# Patient Record
Sex: Female | Born: 1960
Health system: Southern US, Community
[De-identification: ages and names within clinical notes are randomized; demographics above are authoritative.]

---

## 2003-02-05 ENCOUNTER — Other Ambulatory Visit: Admission: RE | Admit: 2003-02-05 | Discharge: 2003-02-05 | Payer: Self-pay | Admitting: *Deleted

## 2005-01-14 ENCOUNTER — Other Ambulatory Visit: Admission: RE | Admit: 2005-01-14 | Discharge: 2005-01-14 | Payer: Self-pay | Admitting: *Deleted

## 2005-02-08 ENCOUNTER — Encounter: Admission: RE | Admit: 2005-02-08 | Discharge: 2005-02-08 | Payer: Self-pay | Admitting: *Deleted

## 2005-02-15 ENCOUNTER — Encounter: Admission: RE | Admit: 2005-02-15 | Discharge: 2005-05-16 | Payer: Self-pay | Admitting: Family Medicine

## 2006-01-16 ENCOUNTER — Other Ambulatory Visit: Admission: RE | Admit: 2006-01-16 | Discharge: 2006-01-16 | Payer: Self-pay | Admitting: *Deleted

## 2006-02-09 ENCOUNTER — Encounter: Admission: RE | Admit: 2006-02-09 | Discharge: 2006-02-09 | Payer: Self-pay | Admitting: Family Medicine

## 2007-01-25 ENCOUNTER — Other Ambulatory Visit: Admission: RE | Admit: 2007-01-25 | Discharge: 2007-01-25 | Payer: Self-pay | Admitting: Family Medicine

## 2007-02-26 ENCOUNTER — Encounter: Admission: RE | Admit: 2007-02-26 | Discharge: 2007-02-26 | Payer: Self-pay | Admitting: Family Medicine

## 2007-12-25 ENCOUNTER — Other Ambulatory Visit: Admission: RE | Admit: 2007-12-25 | Discharge: 2007-12-25 | Payer: Self-pay | Admitting: Family Medicine

## 2008-02-27 ENCOUNTER — Encounter: Admission: RE | Admit: 2008-02-27 | Discharge: 2008-02-27 | Payer: Self-pay | Admitting: Family Medicine

## 2008-12-26 ENCOUNTER — Other Ambulatory Visit: Admission: RE | Admit: 2008-12-26 | Discharge: 2008-12-26 | Payer: Self-pay | Admitting: Family Medicine

## 2009-12-28 ENCOUNTER — Encounter: Admission: RE | Admit: 2009-12-28 | Discharge: 2009-12-28 | Payer: Self-pay | Admitting: Family Medicine

## 2009-12-31 ENCOUNTER — Other Ambulatory Visit: Admission: RE | Admit: 2009-12-31 | Discharge: 2009-12-31 | Payer: Self-pay | Admitting: Family Medicine

## 2010-10-20 ENCOUNTER — Other Ambulatory Visit: Payer: Self-pay | Admitting: Family Medicine

## 2010-10-20 DIAGNOSIS — Z1231 Encounter for screening mammogram for malignant neoplasm of breast: Secondary | ICD-10-CM

## 2010-12-30 ENCOUNTER — Ambulatory Visit
Admission: RE | Admit: 2010-12-30 | Discharge: 2010-12-30 | Disposition: A | Discharge status: Home / Self Care | Payer: BC Managed Care – PPO | Source: Ambulatory Visit | Attending: Family Medicine | Admitting: Family Medicine

## 2010-12-30 DIAGNOSIS — Z1231 Encounter for screening mammogram for malignant neoplasm of breast: Secondary | ICD-10-CM

## 2011-10-25 ENCOUNTER — Other Ambulatory Visit: Payer: Self-pay | Admitting: Family Medicine

## 2011-10-25 DIAGNOSIS — Z1231 Encounter for screening mammogram for malignant neoplasm of breast: Secondary | ICD-10-CM

## 2011-12-27 ENCOUNTER — Other Ambulatory Visit (HOSPITAL_COMMUNITY)
Admission: RE | Admit: 2011-12-27 | Discharge: 2011-12-27 | Disposition: A | Discharge status: Home / Self Care | Payer: BC Managed Care – PPO | Source: Ambulatory Visit | Attending: Family Medicine | Admitting: Family Medicine

## 2011-12-27 ENCOUNTER — Other Ambulatory Visit: Payer: Self-pay | Admitting: Family Medicine

## 2011-12-27 DIAGNOSIS — Z124 Encounter for screening for malignant neoplasm of cervix: Secondary | ICD-10-CM | POA: Insufficient documentation

## 2011-12-27 DIAGNOSIS — Z1159 Encounter for screening for other viral diseases: Secondary | ICD-10-CM | POA: Insufficient documentation

## 2012-01-03 ENCOUNTER — Ambulatory Visit
Admission: RE | Admit: 2012-01-03 | Discharge: 2012-01-03 | Disposition: A | Discharge status: Home / Self Care | Payer: BC Managed Care – PPO | Source: Ambulatory Visit | Attending: Family Medicine | Admitting: Family Medicine

## 2012-01-03 DIAGNOSIS — Z1231 Encounter for screening mammogram for malignant neoplasm of breast: Secondary | ICD-10-CM

## 2012-11-26 ENCOUNTER — Other Ambulatory Visit: Payer: Self-pay

## 2012-11-26 DIAGNOSIS — Z1231 Encounter for screening mammogram for malignant neoplasm of breast: Secondary | ICD-10-CM

## 2013-01-03 ENCOUNTER — Ambulatory Visit
Admission: RE | Admit: 2013-01-03 | Discharge: 2013-01-03 | Disposition: A | Discharge status: Home / Self Care | Payer: BC Managed Care – PPO | Source: Ambulatory Visit

## 2013-01-03 DIAGNOSIS — Z1231 Encounter for screening mammogram for malignant neoplasm of breast: Secondary | ICD-10-CM

## 2014-11-04 ENCOUNTER — Other Ambulatory Visit: Payer: Self-pay

## 2014-11-04 DIAGNOSIS — Z1231 Encounter for screening mammogram for malignant neoplasm of breast: Secondary | ICD-10-CM

## 2014-12-31 ENCOUNTER — Ambulatory Visit
Admission: RE | Admit: 2014-12-31 | Discharge: 2014-12-31 | Disposition: A | Discharge status: Home / Self Care | Payer: BLUE CROSS/BLUE SHIELD | Source: Ambulatory Visit

## 2014-12-31 DIAGNOSIS — Z1231 Encounter for screening mammogram for malignant neoplasm of breast: Secondary | ICD-10-CM

## 2015-11-24 ENCOUNTER — Other Ambulatory Visit: Payer: Self-pay

## 2015-11-24 DIAGNOSIS — Z1231 Encounter for screening mammogram for malignant neoplasm of breast: Secondary | ICD-10-CM

## 2016-01-01 ENCOUNTER — Ambulatory Visit
Admission: RE | Admit: 2016-01-01 | Discharge: 2016-01-01 | Disposition: A | Discharge status: Home / Self Care | Payer: 59 | Source: Ambulatory Visit

## 2016-01-01 DIAGNOSIS — Z1231 Encounter for screening mammogram for malignant neoplasm of breast: Secondary | ICD-10-CM

## 2016-11-08 ENCOUNTER — Other Ambulatory Visit: Payer: Self-pay | Admitting: Family Medicine

## 2016-11-08 DIAGNOSIS — Z1231 Encounter for screening mammogram for malignant neoplasm of breast: Secondary | ICD-10-CM

## 2016-11-15 ENCOUNTER — Other Ambulatory Visit: Payer: Self-pay | Admitting: Family Medicine

## 2016-11-15 DIAGNOSIS — Z780 Asymptomatic menopausal state: Secondary | ICD-10-CM

## 2017-01-02 ENCOUNTER — Ambulatory Visit: Payer: 59

## 2017-01-02 ENCOUNTER — Ambulatory Visit
Admission: RE | Admit: 2017-01-02 | Discharge: 2017-01-02 | Disposition: A | Discharge status: Home / Self Care | Payer: 59 | Source: Ambulatory Visit | Attending: Family Medicine | Admitting: Family Medicine

## 2017-01-02 DIAGNOSIS — Z780 Asymptomatic menopausal state: Secondary | ICD-10-CM | POA: Diagnosis not present

## 2017-01-02 DIAGNOSIS — M810 Age-related osteoporosis without current pathological fracture: Secondary | ICD-10-CM | POA: Diagnosis not present

## 2017-01-02 DIAGNOSIS — Z1231 Encounter for screening mammogram for malignant neoplasm of breast: Secondary | ICD-10-CM | POA: Diagnosis not present

## 2017-01-04 DIAGNOSIS — D126 Benign neoplasm of colon, unspecified: Secondary | ICD-10-CM | POA: Diagnosis not present

## 2017-01-04 DIAGNOSIS — Z1211 Encounter for screening for malignant neoplasm of colon: Secondary | ICD-10-CM | POA: Diagnosis not present

## 2017-01-04 DIAGNOSIS — K621 Rectal polyp: Secondary | ICD-10-CM | POA: Diagnosis not present

## 2017-01-06 DIAGNOSIS — M810 Age-related osteoporosis without current pathological fracture: Secondary | ICD-10-CM | POA: Diagnosis not present

## 2017-02-13 DIAGNOSIS — Z0100 Encounter for examination of eyes and vision without abnormal findings: Secondary | ICD-10-CM | POA: Diagnosis not present

## 2017-03-29 DIAGNOSIS — E559 Vitamin D deficiency, unspecified: Secondary | ICD-10-CM | POA: Diagnosis not present

## 2017-08-23 DIAGNOSIS — R0982 Postnasal drip: Secondary | ICD-10-CM | POA: Diagnosis not present

## 2017-11-14 ENCOUNTER — Other Ambulatory Visit: Payer: Self-pay | Admitting: Family Medicine

## 2017-11-14 DIAGNOSIS — Z1231 Encounter for screening mammogram for malignant neoplasm of breast: Secondary | ICD-10-CM

## 2017-12-26 ENCOUNTER — Other Ambulatory Visit: Payer: Self-pay | Admitting: Family Medicine

## 2017-12-26 ENCOUNTER — Other Ambulatory Visit (HOSPITAL_COMMUNITY)
Admission: RE | Admit: 2017-12-26 | Discharge: 2017-12-26 | Disposition: A | Discharge status: Home / Self Care | Payer: 59 | Source: Ambulatory Visit | Attending: Family Medicine | Admitting: Family Medicine

## 2017-12-26 DIAGNOSIS — Z0000 Encounter for general adult medical examination without abnormal findings: Secondary | ICD-10-CM | POA: Diagnosis not present

## 2017-12-26 DIAGNOSIS — Z124 Encounter for screening for malignant neoplasm of cervix: Secondary | ICD-10-CM | POA: Diagnosis not present

## 2017-12-26 DIAGNOSIS — E559 Vitamin D deficiency, unspecified: Secondary | ICD-10-CM | POA: Diagnosis not present

## 2017-12-26 DIAGNOSIS — Z1159 Encounter for screening for other viral diseases: Secondary | ICD-10-CM | POA: Diagnosis not present

## 2017-12-26 DIAGNOSIS — M810 Age-related osteoporosis without current pathological fracture: Secondary | ICD-10-CM | POA: Diagnosis not present

## 2017-12-27 LAB — CYTOLOGY - PAP
Diagnosis: NEGATIVE
HPV: NOT DETECTED

## 2018-01-03 ENCOUNTER — Ambulatory Visit
Admission: RE | Admit: 2018-01-03 | Discharge: 2018-01-03 | Disposition: A | Discharge status: Home / Self Care | Payer: 59 | Source: Ambulatory Visit | Attending: Family Medicine | Admitting: Family Medicine

## 2018-01-03 DIAGNOSIS — Z1231 Encounter for screening mammogram for malignant neoplasm of breast: Secondary | ICD-10-CM | POA: Diagnosis not present

## 2018-01-05 ENCOUNTER — Ambulatory Visit (INDEPENDENT_AMBULATORY_CARE_PROVIDER_SITE_OTHER): Payer: 59

## 2018-01-05 ENCOUNTER — Encounter: Payer: Self-pay | Admitting: Podiatry

## 2018-01-05 ENCOUNTER — Other Ambulatory Visit: Payer: Self-pay | Admitting: Podiatry

## 2018-01-05 ENCOUNTER — Ambulatory Visit (INDEPENDENT_AMBULATORY_CARE_PROVIDER_SITE_OTHER): Payer: 59 | Admitting: Podiatry

## 2018-01-05 DIAGNOSIS — M779 Enthesopathy, unspecified: Secondary | ICD-10-CM

## 2018-01-05 DIAGNOSIS — M79671 Pain in right foot: Secondary | ICD-10-CM | POA: Diagnosis not present

## 2018-01-05 DIAGNOSIS — M79672 Pain in left foot: Secondary | ICD-10-CM

## 2018-01-05 NOTE — Progress Notes (Signed)
Subjective:   Patient ID: Jenna Phillips, female   DOB: 57 y.o.   MRN: [id number]   HPI Patient presents with chronic pain in the balls of both feet stating they have been stinging and at times they really bother her and at times not and she cannot say exactly where the pain comes from.  Patient does not smoke and likes to be active   Review of Systems  All other systems reviewed and are negative.       Objective:  Physical Exam  Constitutional: She appears well-developed and well-nourished.  Cardiovascular: Intact distal pulses.  Pulmonary/Chest: Effort normal.  Musculoskeletal: Normal range of motion.  Neurological: She is alert.  Skin: Skin is warm.  Nursing note and vitals reviewed.   Neurovascular status intact muscle strength is adequate range of motion within normal limits with patient found to have pain across the metatarsal phalangeal joint of both feet 234 with fluid buildup around the joint surfaces.  It is localized and I was unable to find any area of specific intense discomfort     Assessment:  Inflammatory capsulitis of the lesser MPJs bilateral nondescript in its nature with the pain spread across all metatarsals with patient also noted to have moderate bunion deformity bilateral that is nonsymptomatic     Plan:  H&P x-rays reviewed and today I recommended orthotics and patient will be seen by ped orthotist for accommodative type orthotics to reduce stress on the metatarsals.  She will bring her shoes and at that time and I recommended tennis shoes to be the best type shoes for this patient to bring in  X-rays indicate that there is no signs of fracture or signs of advanced arthritis bilateral

## 2018-01-16 ENCOUNTER — Ambulatory Visit (INDEPENDENT_AMBULATORY_CARE_PROVIDER_SITE_OTHER): Payer: 59 | Admitting: Orthotics

## 2018-01-16 DIAGNOSIS — M779 Enthesopathy, unspecified: Secondary | ICD-10-CM

## 2018-01-16 DIAGNOSIS — M79671 Pain in right foot: Secondary | ICD-10-CM

## 2018-01-16 DIAGNOSIS — M79672 Pain in left foot: Secondary | ICD-10-CM

## 2018-01-16 NOTE — Progress Notes (Signed)
Patient cast for f/o per Dr. Paulla Dolly; presents with bilat forefoot discomfort esp Right; also complains today of pain under first MPJ.  Plan on LW accommodative f/o with good longitudinal arch support and ff cushioning; also 1st mpj offload R

## 2018-01-22 ENCOUNTER — Telehealth: Payer: Self-pay | Admitting: Podiatry

## 2018-01-22 NOTE — Telephone Encounter (Signed)
Called pt and left message that per her insurance orthotics are not covered and the cost would be 398.00

## 2018-01-23 NOTE — Telephone Encounter (Signed)
Pt returned call and is aware they are not covered and wants to proceed with them anyway.

## 2018-02-06 ENCOUNTER — Ambulatory Visit (INDEPENDENT_AMBULATORY_CARE_PROVIDER_SITE_OTHER): Payer: 59 | Admitting: Orthotics

## 2018-02-06 DIAGNOSIS — M79671 Pain in right foot: Secondary | ICD-10-CM

## 2018-02-06 DIAGNOSIS — M79672 Pain in left foot: Principal | ICD-10-CM

## 2018-02-06 DIAGNOSIS — M779 Enthesopathy, unspecified: Secondary | ICD-10-CM

## 2018-02-06 NOTE — Progress Notes (Signed)
Patient came in today to pick up custom made foot orthotics.  The goals were accomplished and the patient reported no dissatisfaction with said orthotics.  Patient was advised of breakin period and how to report any issues. 

## 2018-02-19 DIAGNOSIS — Z0100 Encounter for examination of eyes and vision without abnormal findings: Secondary | ICD-10-CM | POA: Diagnosis not present

## 2018-03-27 DIAGNOSIS — M79674 Pain in right toe(s): Secondary | ICD-10-CM | POA: Diagnosis not present

## 2018-10-16 ENCOUNTER — Other Ambulatory Visit: Payer: Self-pay | Admitting: Family Medicine

## 2018-10-16 DIAGNOSIS — Z1231 Encounter for screening mammogram for malignant neoplasm of breast: Secondary | ICD-10-CM

## 2019-01-01 ENCOUNTER — Other Ambulatory Visit: Payer: Self-pay | Admitting: Family Medicine

## 2019-01-01 DIAGNOSIS — M810 Age-related osteoporosis without current pathological fracture: Secondary | ICD-10-CM

## 2019-01-07 ENCOUNTER — Other Ambulatory Visit: Payer: Self-pay

## 2019-01-07 ENCOUNTER — Ambulatory Visit
Admission: RE | Admit: 2019-01-07 | Discharge: 2019-01-07 | Disposition: A | Discharge status: Home / Self Care | Payer: 59 | Source: Ambulatory Visit | Attending: Family Medicine | Admitting: Family Medicine

## 2019-01-07 DIAGNOSIS — Z1231 Encounter for screening mammogram for malignant neoplasm of breast: Secondary | ICD-10-CM

## 2019-01-23 ENCOUNTER — Ambulatory Visit
Admission: RE | Admit: 2019-01-23 | Discharge: 2019-01-23 | Disposition: A | Discharge status: Home / Self Care | Payer: 59 | Source: Ambulatory Visit | Attending: Family Medicine | Admitting: Family Medicine

## 2019-01-23 DIAGNOSIS — M810 Age-related osteoporosis without current pathological fracture: Secondary | ICD-10-CM

## 2019-11-26 ENCOUNTER — Other Ambulatory Visit: Payer: Self-pay | Admitting: Family Medicine

## 2019-11-26 DIAGNOSIS — Z1231 Encounter for screening mammogram for malignant neoplasm of breast: Secondary | ICD-10-CM

## 2020-01-08 ENCOUNTER — Ambulatory Visit
Admission: RE | Admit: 2020-01-08 | Discharge: 2020-01-08 | Disposition: A | Discharge status: Home / Self Care | Payer: 59 | Source: Ambulatory Visit | Attending: Family Medicine | Admitting: Family Medicine

## 2020-01-08 ENCOUNTER — Other Ambulatory Visit: Payer: Self-pay

## 2020-01-08 DIAGNOSIS — Z1231 Encounter for screening mammogram for malignant neoplasm of breast: Secondary | ICD-10-CM

## 2020-09-23 IMAGING — MG DIGITAL SCREENING BILAT W/ TOMO W/ CAD
8 series · 9 of 24 positions shown · non-contrast
Comparison: Previous exam(s).

CLINICAL DATA: Screening.

EXAM:
DIGITAL SCREENING BILATERAL MAMMOGRAM WITH TOMO AND CAD

[L MLO synth-2D]
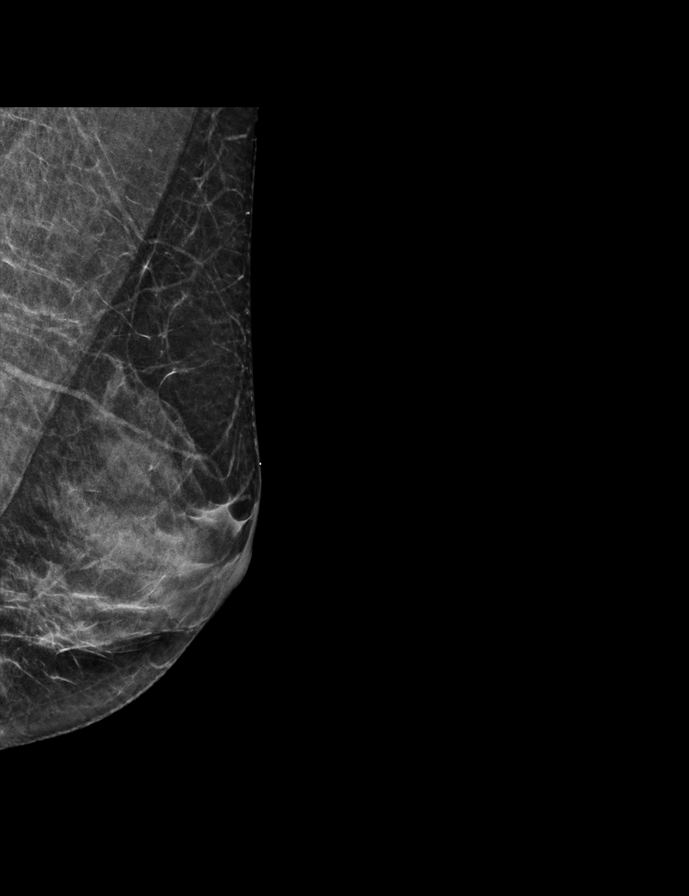

[L CC synth-2D]
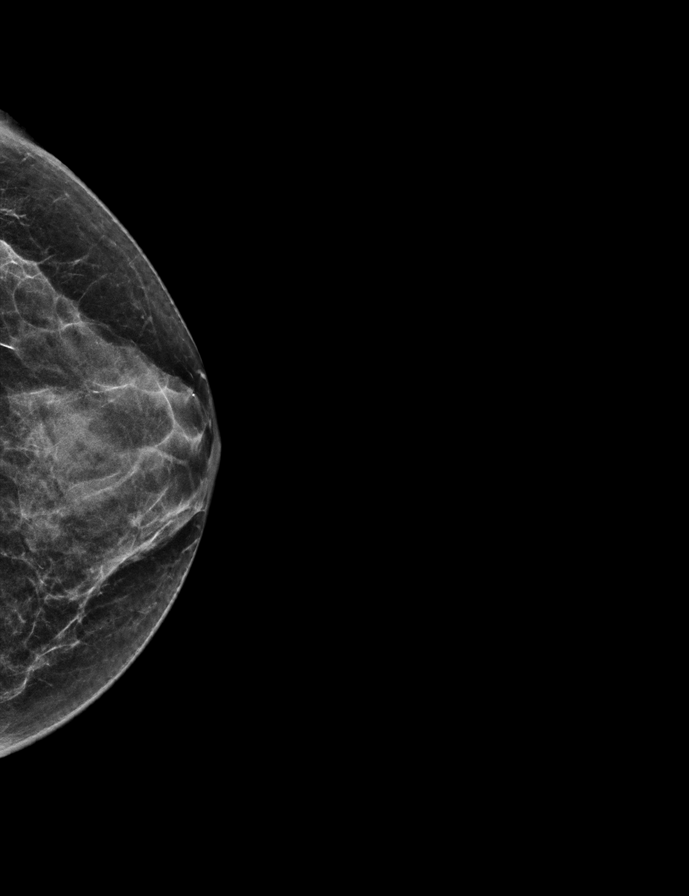

[R CC synth-2D]
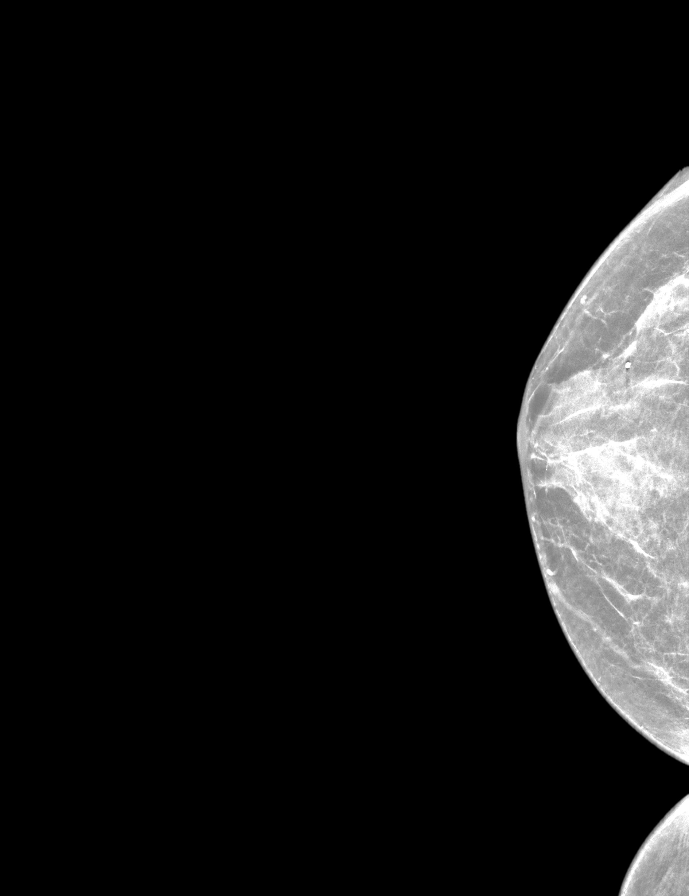

[R MLO synth-2D]
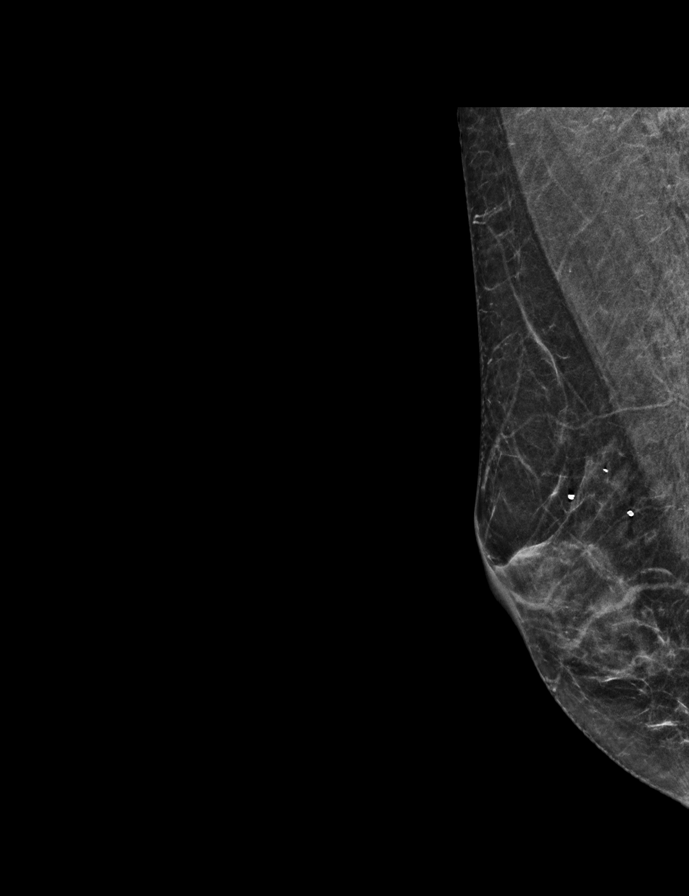

[R CC tomo · 2 of 45 frames shown]
[frame 15/45]
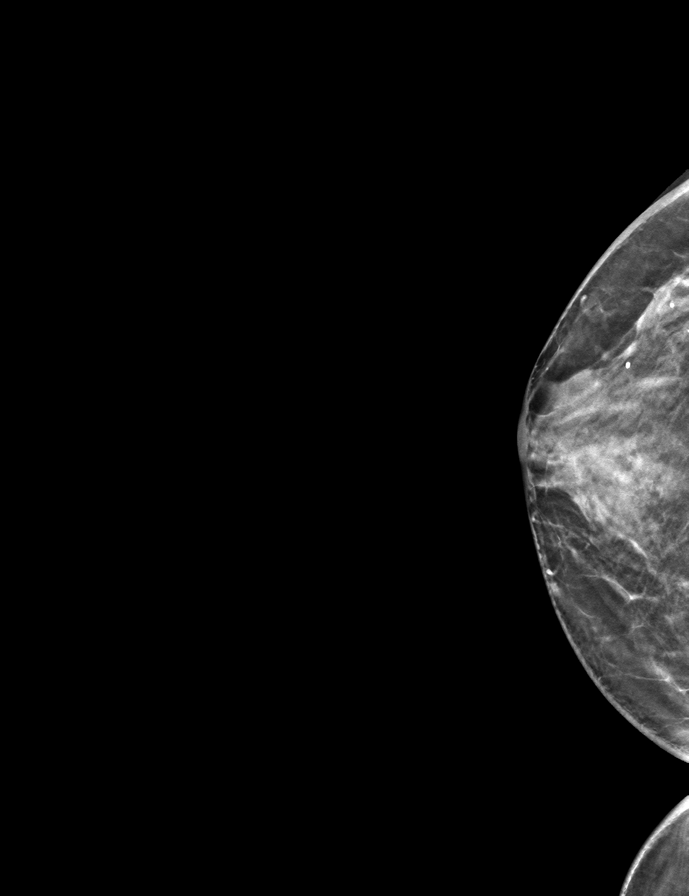
[frame 23/45]
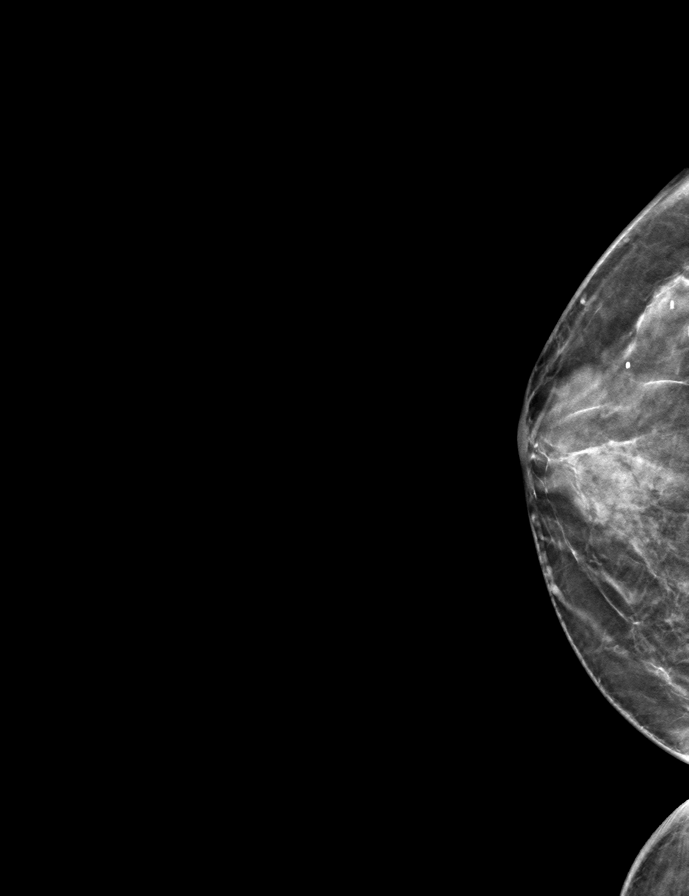

[L MLO tomo · tomo slice 25/48.0]
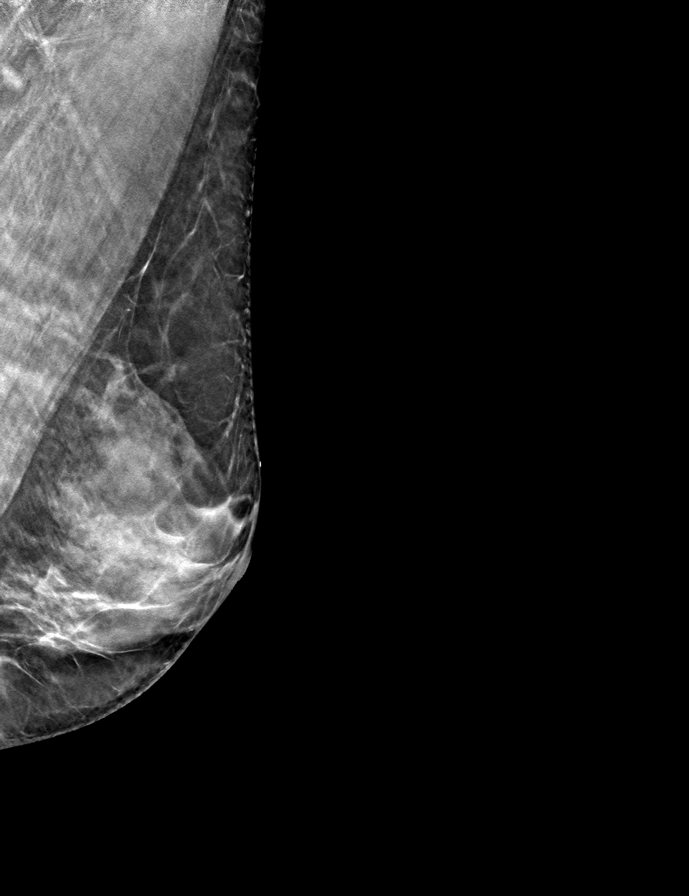

[R MLO tomo · tomo slice 23/45.0]
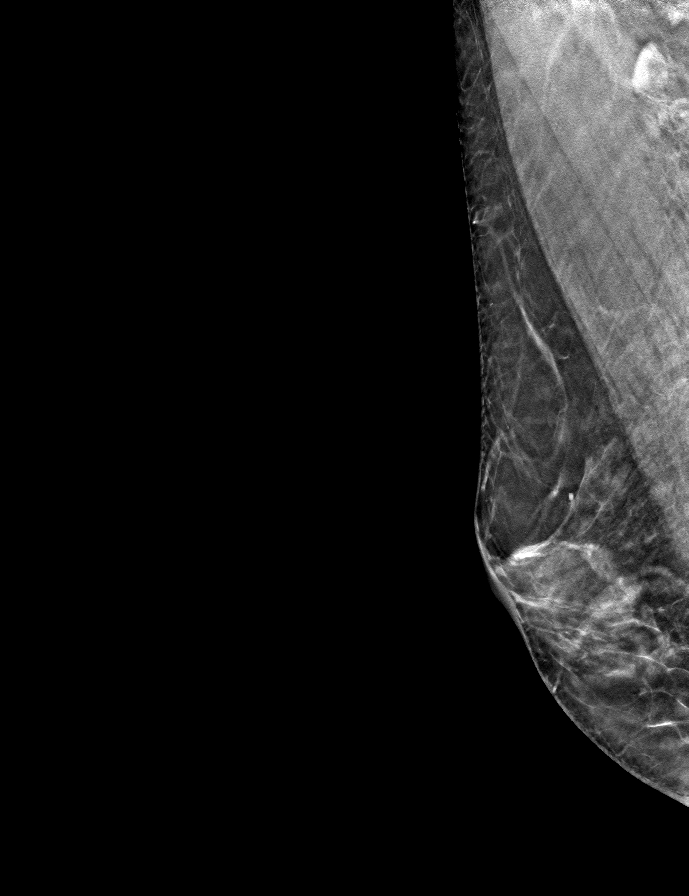

[L CC tomo · tomo slice 25/48.0]
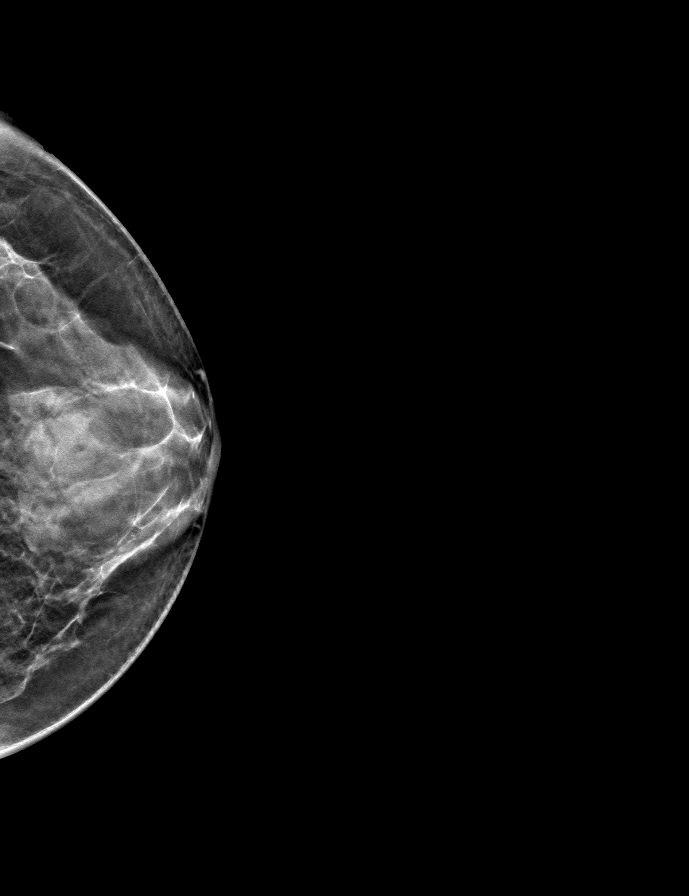

[9 of 24 positions shown; findings below may reference images not displayed]

ACR Breast Density Category c: The breast tissue is heterogeneously
dense, which may obscure small masses.
FINDINGS: There are no findings suspicious for malignancy. Images were
processed with CAD.
IMPRESSION: No mammographic evidence of malignancy. A result letter of this
screening mammogram will be mailed directly to the patient.

RECOMMENDATION:
Screening mammogram in one year. (Code:FT-[id number])

BI-RADS CATEGORY  1: Negative.

## 2020-11-10 ENCOUNTER — Other Ambulatory Visit: Payer: Self-pay | Admitting: Family Medicine

## 2020-11-10 DIAGNOSIS — Z1231 Encounter for screening mammogram for malignant neoplasm of breast: Secondary | ICD-10-CM

## 2021-01-13 ENCOUNTER — Ambulatory Visit
Admission: RE | Admit: 2021-01-13 | Discharge: 2021-01-13 | Disposition: A | Discharge status: Home / Self Care | Payer: 59 | Source: Ambulatory Visit | Attending: Family Medicine | Admitting: Family Medicine

## 2021-01-13 ENCOUNTER — Other Ambulatory Visit: Payer: Self-pay

## 2021-01-13 DIAGNOSIS — Z1231 Encounter for screening mammogram for malignant neoplasm of breast: Secondary | ICD-10-CM

## 2021-09-14 ENCOUNTER — Other Ambulatory Visit: Payer: Self-pay | Admitting: Family Medicine

## 2021-09-14 DIAGNOSIS — Z1231 Encounter for screening mammogram for malignant neoplasm of breast: Secondary | ICD-10-CM

## 2022-01-17 ENCOUNTER — Ambulatory Visit
Admission: RE | Admit: 2022-01-17 | Discharge: 2022-01-17 | Disposition: A | Discharge status: Home / Self Care | Payer: 59 | Source: Ambulatory Visit | Attending: Family Medicine | Admitting: Family Medicine

## 2022-01-17 DIAGNOSIS — Z1231 Encounter for screening mammogram for malignant neoplasm of breast: Secondary | ICD-10-CM

## 2022-01-25 ENCOUNTER — Other Ambulatory Visit: Payer: Self-pay | Admitting: Family Medicine

## 2022-01-25 DIAGNOSIS — M8580 Other specified disorders of bone density and structure, unspecified site: Secondary | ICD-10-CM

## 2022-09-28 ENCOUNTER — Other Ambulatory Visit: Payer: Self-pay | Admitting: Family Medicine

## 2022-09-28 DIAGNOSIS — Z1231 Encounter for screening mammogram for malignant neoplasm of breast: Secondary | ICD-10-CM

## 2023-01-19 ENCOUNTER — Ambulatory Visit
Admission: RE | Admit: 2023-01-19 | Discharge: 2023-01-19 | Disposition: A | Discharge status: Home / Self Care | Payer: 59 | Source: Ambulatory Visit | Attending: Family Medicine | Admitting: Family Medicine

## 2023-01-19 DIAGNOSIS — Z1231 Encounter for screening mammogram for malignant neoplasm of breast: Secondary | ICD-10-CM

## 2023-10-03 ENCOUNTER — Other Ambulatory Visit: Payer: Self-pay | Admitting: Family Medicine

## 2023-10-03 DIAGNOSIS — Z1231 Encounter for screening mammogram for malignant neoplasm of breast: Secondary | ICD-10-CM

## 2023-11-09 ENCOUNTER — Other Ambulatory Visit: Payer: Self-pay | Admitting: Family Medicine

## 2023-11-09 DIAGNOSIS — M8580 Other specified disorders of bone density and structure, unspecified site: Secondary | ICD-10-CM

## 2024-01-23 ENCOUNTER — Ambulatory Visit

## 2024-01-30 ENCOUNTER — Ambulatory Visit
Admission: RE | Admit: 2024-01-30 | Discharge: 2024-01-30 | Disposition: A | Discharge status: Home / Self Care | Source: Ambulatory Visit | Attending: Family Medicine | Admitting: Family Medicine

## 2024-01-30 DIAGNOSIS — Z1231 Encounter for screening mammogram for malignant neoplasm of breast: Secondary | ICD-10-CM

## 2024-02-02 ENCOUNTER — Other Ambulatory Visit: Payer: Self-pay | Admitting: Family Medicine

## 2024-02-02 DIAGNOSIS — R928 Other abnormal and inconclusive findings on diagnostic imaging of breast: Secondary | ICD-10-CM

## 2024-02-13 ENCOUNTER — Other Ambulatory Visit

## 2024-02-13 ENCOUNTER — Ambulatory Visit

## 2024-02-13 ENCOUNTER — Ambulatory Visit
Admission: RE | Admit: 2024-02-13 | Discharge: 2024-02-13 | Disposition: A | Discharge status: Home / Self Care | Source: Ambulatory Visit | Attending: Family Medicine | Admitting: Family Medicine

## 2024-02-13 DIAGNOSIS — R928 Other abnormal and inconclusive findings on diagnostic imaging of breast: Secondary | ICD-10-CM

## 2024-05-29 ENCOUNTER — Other Ambulatory Visit: Payer: Self-pay | Admitting: Family Medicine

## 2024-05-29 DIAGNOSIS — Z1231 Encounter for screening mammogram for malignant neoplasm of breast: Secondary | ICD-10-CM

## 2025-02-04 ENCOUNTER — Ambulatory Visit
